# Patient Record
Sex: Male | Born: 1980 | Hispanic: Yes | Marital: Married | Smoking: Never smoker
Health system: Southern US, Community
[De-identification: ages and names within clinical notes are randomized; demographics above are authoritative.]

---

## 2011-05-05 ENCOUNTER — Emergency Department (HOSPITAL_COMMUNITY): Payer: Self-pay

## 2011-05-05 ENCOUNTER — Emergency Department (HOSPITAL_COMMUNITY)
Admission: EM | Admit: 2011-05-05 | Discharge: 2011-05-05 | Disposition: A | Discharge status: Home / Self Care | Payer: Self-pay | Attending: Emergency Medicine | Admitting: Emergency Medicine

## 2011-05-05 ENCOUNTER — Encounter: Payer: Self-pay | Admitting: *Deleted

## 2011-05-05 DIAGNOSIS — R10813 Right lower quadrant abdominal tenderness: Secondary | ICD-10-CM | POA: Insufficient documentation

## 2011-05-05 DIAGNOSIS — R109 Unspecified abdominal pain: Secondary | ICD-10-CM | POA: Insufficient documentation

## 2011-05-05 LAB — BASIC METABOLIC PANEL
BUN: 13 mg/dL (ref 6–23)
Chloride: 100 mEq/L (ref 96–112)
Glucose, Bld: 102 mg/dL — ABNORMAL HIGH (ref 70–99)
Potassium: 4.4 mEq/L (ref 3.5–5.1)

## 2011-05-05 LAB — URINALYSIS, ROUTINE W REFLEX MICROSCOPIC
Bilirubin Urine: NEGATIVE
Hgb urine dipstick: NEGATIVE
Specific Gravity, Urine: 1.029 (ref 1.005–1.030)
pH: 6 (ref 5.0–8.0)

## 2011-05-05 LAB — DIFFERENTIAL
Eosinophils Absolute: 0 10*3/uL (ref 0.0–0.7)
Lymphs Abs: 1.8 10*3/uL (ref 0.7–4.0)
Monocytes Absolute: 0.6 10*3/uL (ref 0.1–1.0)
Monocytes Relative: 5 % (ref 3–12)
Neutro Abs: 11.3 10*3/uL — ABNORMAL HIGH (ref 1.7–7.7)
Neutrophils Relative %: 82 % — ABNORMAL HIGH (ref 43–77)

## 2011-05-05 LAB — CBC
HCT: 42.6 % (ref 39.0–52.0)
Hemoglobin: 14.3 g/dL (ref 13.0–17.0)
MCH: 27.7 pg (ref 26.0–34.0)
RBC: 5.17 MIL/uL (ref 4.22–5.81)

## 2011-05-05 MED ORDER — SODIUM CHLORIDE 0.9 % IV SOLN: 20.0000 mL | INTRAVENOUS | Status: DC

## 2011-05-05 MED ORDER — IOHEXOL 300 MG/ML  SOLN
100.0000 mL | Freq: Once | INTRAMUSCULAR | Status: AC | PRN
  Administered 2011-05-05: 100 mL via INTRAVENOUS

## 2011-05-05 NOTE — ED Notes (Signed)
Ct done.

## 2011-05-05 NOTE — ED Notes (Signed)
RN getting labs when starting IV 

## 2011-05-05 NOTE — ED Notes (Signed)
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

## 2011-05-05 NOTE — ED Notes (Signed)
Surgery in to see patient.

## 2011-05-05 NOTE — ED Provider Notes (Signed)
History     CSN: [id number] Arrival date & time: 05/05/2011  5:36 PM   First MD Initiated Contact with Patient 05/05/11 1854      Chief Complaint  Patient presents with  . Abdominal Pain    (Consider location/radiation/quality/duration/timing/severity/associated sxs/prior treatment) Patient is a 30 y.o. male presenting with abdominal pain. The history is provided by the patient.  Abdominal Pain The primary symptoms of the illness include abdominal pain. The primary symptoms of the illness do not include fever, nausea, vomiting or diarrhea. Episode onset: today. The onset of the illness was sudden. The problem has been gradually improving.  The patient has not had a change in bowel habit. Symptoms associated with the illness do not include chills, constipation, urgency, hematuria, frequency or back pain. Significant associated medical issues do not include diabetes.   Pt notes pain is located in the RLQ, has improved but persists.  History reviewed. No pertinent past medical history.  History reviewed. No pertinent past surgical history.  No family history on file.  History  Substance Use Topics  . Smoking status: Never Smoker   . Smokeless tobacco: Not on file  . Alcohol Use: No      Review of Systems  Constitutional: Negative.  Negative for fever, chills and appetite change.  Gastrointestinal: Positive for abdominal pain. Negative for nausea, vomiting, diarrhea and constipation.  Genitourinary: Negative for urgency, frequency and hematuria.  Musculoskeletal: Negative for back pain.    Allergies  Review of patient's allergies indicates no known allergies.  Home Medications   Current Outpatient Rx  Name Route Sig Dispense Refill  . RANITIDINE HCL 75 MG PO TABS Oral Take 75 mg by mouth 2 (two) times daily.        BP 132/58  Pulse 64  Temp(Src) 98.6 F (37 C) (Oral)  Resp 16  Wt 200 lb (90.719 kg)  SpO2 100%  Physical Exam  Constitutional: He is oriented to  person, place, and time. He appears well-developed and well-nourished.  HENT:  Head: Normocephalic and atraumatic.  Cardiovascular: Normal rate and regular rhythm.   Pulmonary/Chest: Effort normal and breath sounds normal.  Abdominal: Soft. Bowel sounds are normal. He exhibits no mass. There is tenderness. There is no rebound and no guarding.       Mild to moderate RLQ tenderness.    Musculoskeletal: Normal range of motion.  Neurological: He is alert and oriented to person, place, and time.  Skin: Skin is warm and dry.    ED Course  Procedures (including critical care time)   Labs Reviewed  CBC  DIFFERENTIAL  BASIC METABOLIC PANEL  URINALYSIS, ROUTINE W REFLEX MICROSCOPIC   No results found.   No diagnosis found.    MDM  Pt reexamined, pain has improved, minimal RLQ on reexamination, improved from initial exam.    2200- Discussed with Dr Ezzard Standing who advises discussing with the pt and offering admission vs follow up tomorrow.  Pt requests follow up tomorrow, understands need to return immediately for worsening symptoms.  Discussed with Dr Juleen China who agrees with the plan.        Achilles Dunk [place], [place] 05/05/11 [address] [place], [place] 05/06/11 [phone]

## 2011-05-05 NOTE — ED Notes (Signed)
Patient that his head was hurting made RN aware of it

## 2011-05-06 ENCOUNTER — Encounter (HOSPITAL_COMMUNITY): Payer: Self-pay | Admitting: Emergency Medicine

## 2011-05-06 ENCOUNTER — Emergency Department (HOSPITAL_COMMUNITY)
Admission: EM | Admit: 2011-05-06 | Discharge: 2011-05-06 | Disposition: A | Discharge status: Home / Self Care | Payer: Self-pay | Attending: Emergency Medicine | Admitting: Emergency Medicine

## 2011-05-06 DIAGNOSIS — R1031 Right lower quadrant pain: Secondary | ICD-10-CM | POA: Insufficient documentation

## 2011-05-06 DIAGNOSIS — R630 Anorexia: Secondary | ICD-10-CM | POA: Insufficient documentation

## 2011-05-06 LAB — CBC
HCT: 42.7 % (ref 39.0–52.0)
Hemoglobin: 14.8 g/dL (ref 13.0–17.0)
MCH: 28 pg (ref 26.0–34.0)
MCV: 80.7 fL (ref 78.0–100.0)
RBC: 5.29 MIL/uL (ref 4.22–5.81)

## 2011-05-06 LAB — BASIC METABOLIC PANEL
BUN: 12 mg/dL (ref 6–23)
GFR calc non Af Amer: >90 mL/min (ref >90)
Glucose, Bld: 88 mg/dL (ref 70–99)
Potassium: 3.6 mEq/L (ref 3.5–5.1)

## 2011-05-06 LAB — DIFFERENTIAL
Eosinophils Absolute: 0.1 10*3/uL (ref 0.0–0.7)
Eosinophils Relative: 1 % (ref 0–5)
Lymphs Abs: 1.9 10*3/uL (ref 0.7–4.0)
Monocytes Relative: 6 % (ref 3–12)

## 2011-05-06 NOTE — ED Provider Notes (Signed)
Medical screening examination/treatment/procedure(s) were performed by non-physician practitioner and as supervising physician I was immediately available for consultation/collaboration.  Kerline Trahan, MD 05/06/11 0128 

## 2011-05-06 NOTE — ED Provider Notes (Signed)
History     CSN: [id number] Arrival date & time: 05/06/2011  2:07 PM   First MD Initiated Contact with Patient 05/06/11 1504      Chief Complaint  Patient presents with  . Abdominal Pain    (Consider location/radiation/quality/duration/timing/severity/associated sxs/prior treatment) HPI Comments: Patient presents to the emergency department today for reevaluation for right lower quadrant pain that began yesterday afternoon at approximately 1:30 PM.  Patient was seen here in this emergency department and did have labs completed that showed a white count of 13 and a CT abdomen pelvis that showed possible appendicitis but it was not definitive.  Per the emergency department note the physician did contact the surgeon on call Dr. Ezzard Standing who recommended either admission for observation versus discharge home with followup today.  The patient has come back today for his followup.  He reports that he still has discomfort in his right lower quadrant but it is improved from yesterday.  He's had no nausea or vomiting but does note some decreased appetite.  No changes in bowel habits and had a normal bowel movement this morning.  He denies any fevers.  No prior abdominal surgeries no other medical problems.  He does note that he still has some pain in the right lower quadrant that is worse with moving around.  Patient is a 30 y.o. male presenting with abdominal pain. The history is provided by the patient. No language interpreter was used.  Abdominal Pain The primary symptoms of the illness include abdominal pain. The primary symptoms of the illness do not include fever, fatigue, shortness of breath, nausea, vomiting, diarrhea, hematemesis, hematochezia or dysuria. The current episode started yesterday. The onset of the illness was gradual. The problem has been gradually improving.  The patient has not had a change in bowel habit. Additional symptoms associated with the illness include anorexia. Symptoms  associated with the illness do not include chills, diaphoresis, heartburn, constipation, urgency, hematuria, frequency or back pain.    History reviewed. No pertinent past medical history.  History reviewed. No pertinent past surgical history.  No family history on file.  History  Substance Use Topics  . Smoking status: Never Smoker   . Smokeless tobacco: Not on file  . Alcohol Use: No      Review of Systems  Constitutional: Negative.  Negative for fever, chills, diaphoresis and fatigue.  HENT: Negative.   Eyes: Negative.  Negative for discharge and redness.  Respiratory: Negative.  Negative for cough and shortness of breath.   Cardiovascular: Negative.  Negative for chest pain.  Gastrointestinal: Positive for abdominal pain and anorexia. Negative for heartburn, nausea, vomiting, diarrhea, constipation, hematochezia and hematemesis.  Genitourinary: Negative.  Negative for dysuria, urgency, frequency and hematuria.  Musculoskeletal: Negative.  Negative for back pain.  Skin: Negative.  Negative for color change and rash.  Neurological: Negative for syncope and headaches.  Hematological: Negative.  Negative for adenopathy.  Psychiatric/Behavioral: Negative.  Negative for confusion.  All other systems reviewed and are negative.    Allergies  Review of patient's allergies indicates no known allergies.  Home Medications   Current Outpatient Rx  Name Route Sig Dispense Refill  . RANITIDINE HCL 75 MG PO TABS Oral Take 75 mg by mouth 2 (two) times daily.       There were no vitals taken for this visit.  Physical Exam  Constitutional: He is oriented to person, place, and time. He appears well-developed and well-nourished.  Non-toxic appearance. He does not have a sickly  appearance.  HENT:  Head: Normocephalic and atraumatic.  Eyes: Conjunctivae, EOM and lids are normal. Pupils are equal, round, and reactive to light.  Neck: Trachea normal, normal range of motion and full  passive range of motion without pain. Neck supple.  Cardiovascular: Normal rate, regular rhythm and normal heart sounds.   Pulmonary/Chest: Effort normal and breath sounds normal. No respiratory distress. He has no wheezes. He has no rales.  Abdominal: Soft. Normal appearance. He exhibits no distension. There is tenderness in the right lower quadrant. There is no rebound and no CVA tenderness.       Mild tenderness to palpation in the right lower quadrant.  Musculoskeletal: Normal range of motion.  Neurological: He is alert and oriented to person, place, and time. He has normal strength.  Skin: Skin is warm, dry and intact. No rash noted.  Psychiatric: He has a normal mood and affect. His behavior is normal. Judgment and thought content normal.    ED Course  Procedures (including critical care time)  Results for orders placed during the hospital encounter of 05/06/11  CBC      Component Value Range   WBC 7.8  4.0 - 10.5 (K/uL)   RBC 5.29  4.22 - 5.81 (MIL/uL)   Hemoglobin 14.8  13.0 - 17.0 (g/dL)   HCT 69.6  29.5 - 28.4 (%)   MCV 80.7  78.0 - 100.0 (fL)   MCH 28.0  26.0 - 34.0 (pg)   MCHC 34.7  30.0 - 36.0 (g/dL)   RDW 13.2  44.0 - 10.2 (%)   Platelets 227  150 - 400 (K/uL)  DIFFERENTIAL      Component Value Range   Neutrophils Relative 68  43 - 77 (%)   Neutro Abs 5.3  1.7 - 7.7 (K/uL)   Lymphocytes Relative 24  12 - 46 (%)   Lymphs Abs 1.9  0.7 - 4.0 (K/uL)   Monocytes Relative 6  3 - 12 (%)   Monocytes Absolute 0.5  0.1 - 1.0 (K/uL)   Eosinophils Relative 1  0 - 5 (%)   Eosinophils Absolute 0.1  0.0 - 0.7 (K/uL)   Basophils Relative 0  0 - 1 (%)   Basophils Absolute 0.0  0.0 - 0.1 (K/uL)  BASIC METABOLIC PANEL      Component Value Range   Sodium 140  135 - 145 (mEq/L)   Potassium 3.6  3.5 - 5.1 (mEq/L)   Chloride 103  96 - 112 (mEq/L)   CO2 25  19 - 32 (mEq/L)   Glucose, Bld 88  70 - 99 (mg/dL)   BUN 12  6 - 23 (mg/dL)   Creatinine, Ser 7.25  0.50 - 1.35 (mg/dL)    Calcium 9.6  8.4 - 10.5 (mg/dL)   GFR calc non Af Amer >90  >90 (mL/min)   GFR calc Af Amer >90  >90 (mL/min)   Ct Abdomen Pelvis W Contrast  05/05/2011  *RADIOLOGY REPORT*  Clinical Data: Persistent right lower quadrant pain with some improvement.  CT ABDOMEN AND PELVIS WITH CONTRAST  Technique:  Multidetector CT imaging of the abdomen and pelvis was performed following the standard protocol during bolus administration of intravenous contrast.  Contrast: OMNIPAQUE IOHEXOL 300 MG/ML IV SOLN  Comparison: None.  Findings: The lung bases are clear.  There is no pleural effusion. The liver, gallbladder, biliary system and pancreas appear normal. The spleen, adrenal glands and right kidney appear normal.  The left kidney demonstrates multiple parapelvic cysts.  Delayed images demonstrate no hydronephrosis or delay in contrast excretion. However, there does appear to be mild perinephric soft tissue stranding on the left.  The bowel gas pattern is nonobstructive.  Retrocecal appendix is prominent, measuring 9 mm in diameter on image 64.  There is some air within its lumen.  No obvious surrounding inflammatory change is seen.  The cecum and terminal hilum appear normal.  The urinary bladder, prostate gland and seminal vesicles appear normal.  IMPRESSION:  1.  Mildly distended appendix is without definite surrounding inflammatory change.  This could reflect atypical appendicitis in the appropriate clinical context.  The patient's complete blood count is currently pending. 2.  No evidence of bowel obstruction or perforation. 3.  Left renal parapelvic cysts with mild perinephric soft tissue stranding.  No evidence of ureterectasis or ureteral obstruction.  Original Report Authenticated By: Gerrianne Scale, M.D.       MDM  The patient reevaluated today with improvement in his abdominal pain.  His white blood cell count is also decreased to a normal level.  He is afebrile and with normal vital signs.  I have  rediscussed this patient with Dr. Donell Beers from general surgery and she agrees with plan to discharge the patient home with instructions to return for worsening abdominal pain, fevers, anorexia, vomiting or other signs concerning for possible appendicitis.  Patient is comfortable with this plan at this time.        Nat Christen, MD 05/06/11 [phone]

## 2011-05-06 NOTE — ED Notes (Signed)
Pt is wanting to follow up about possible appendicitis that he was seen for yesterday.  They gave him the option to stay or come back today.  Pt chose to go home and come back.  States his pain is much better.  Denies any n/v/d.  When asked about pain, he states that he feels no pain, it's just uncomfortable.

## 2011-05-06 NOTE — ED Notes (Signed)
Pt was seen ysterday for abd pain and states was instructed to return today for a follow up for appendicitis, denies any pain at this time. No n/v states that he does feel better.

## 2012-08-17 IMAGING — CT CT ABD-PELV W/ CM
1 of 3 series · 14 of 32 positions shown, 19 images · IV contrast (APPLIED)
Comparison: None.

CLINICAL DATA: Persistent right lower quadrant pain with some
improvement.

CT ABDOMEN AND PELVIS WITH CONTRAST
TECHNIQUE: Multidetector CT imaging of the abdomen and pelvis was
performed following the standard protocol during bolus
administration of intravenous contrast.
Contrast: 100mL OMNIPAQUE IOHEXOL 300 MG/ML IV SOLN

[Series 2: abd/pel with · axial · 0.74mm/px · z∈[+874,+1300]mm · 14 of 95 slices shown, 19 images]
[im 5/95  soft-tissue]
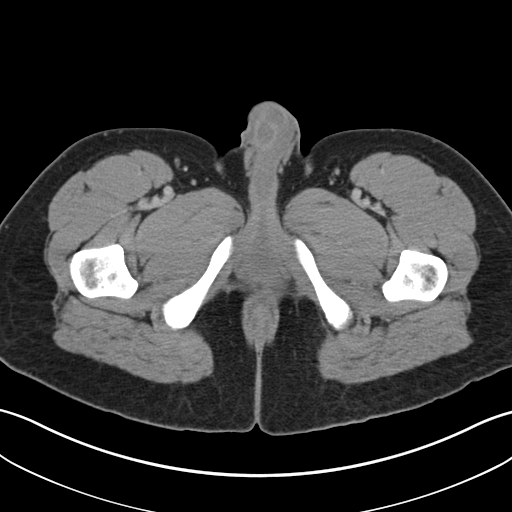
[im 5/95  bone]
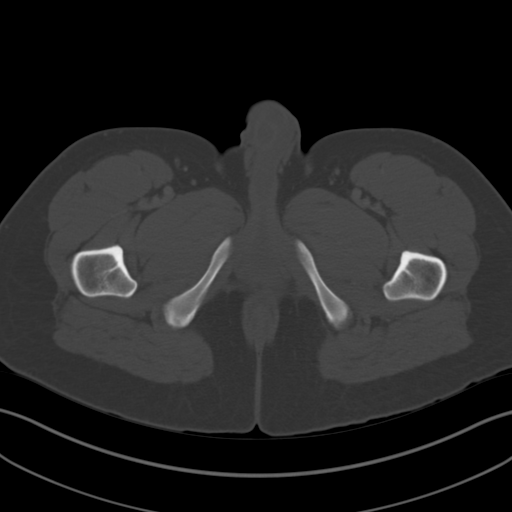
[im 15/95  soft-tissue]
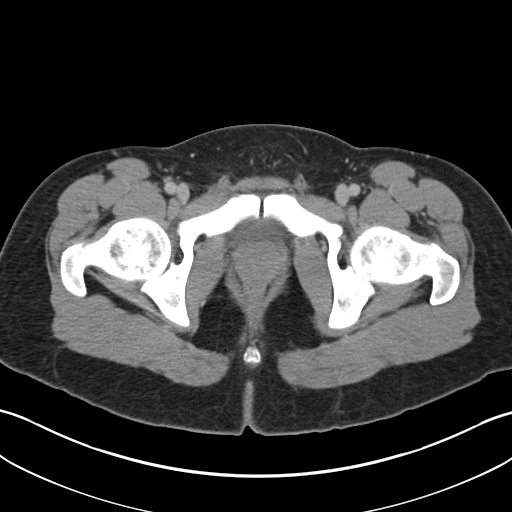
[im 19/95  soft-tissue]
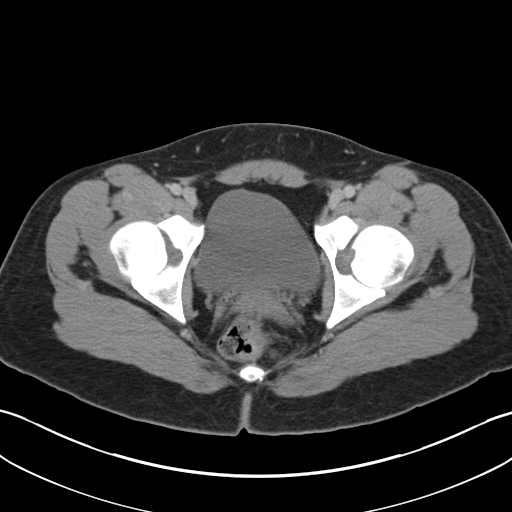
[im 29/95  soft-tissue]
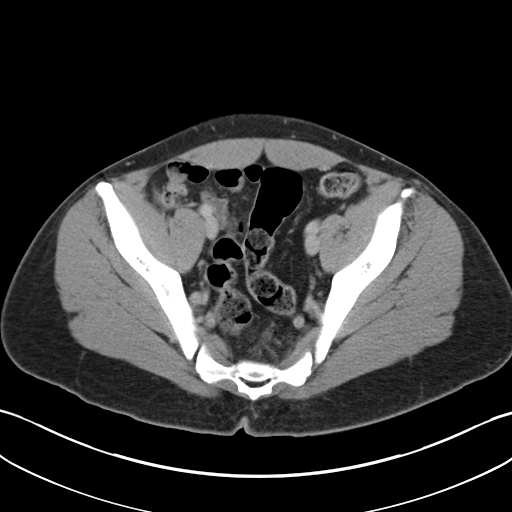
[im 33/95  soft-tissue]
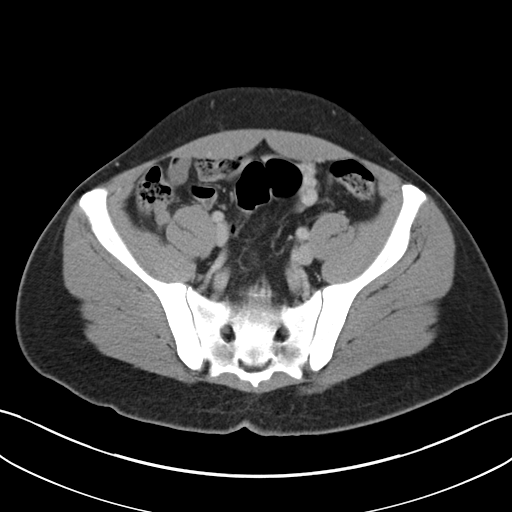
[im 43/95  soft-tissue]
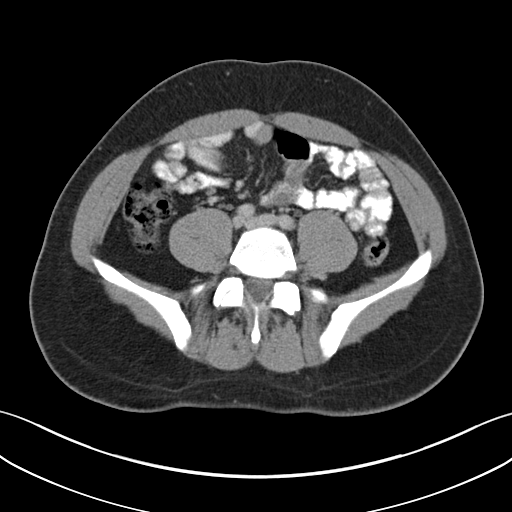
[im 48/95  soft-tissue]
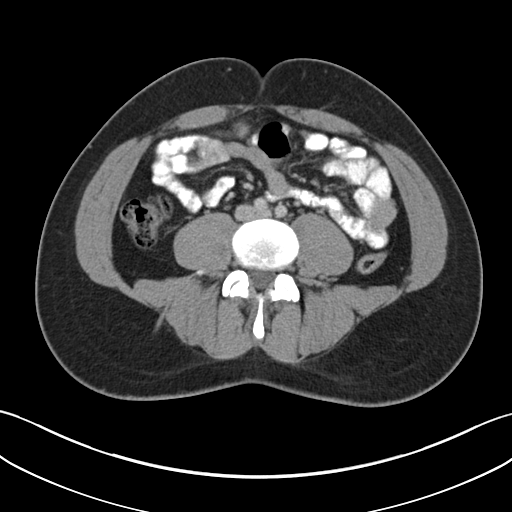
[im 52/95  soft-tissue]
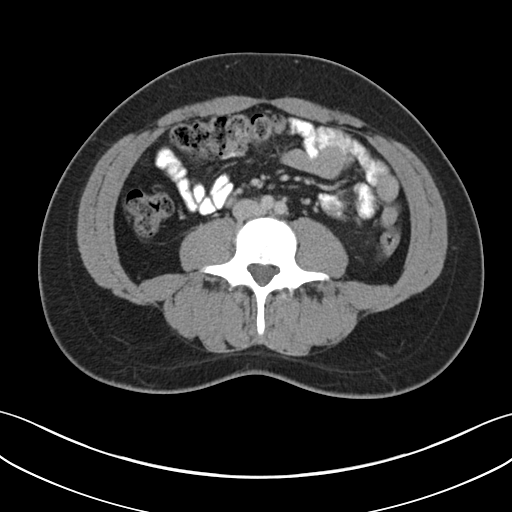
[im 62/95  soft-tissue]
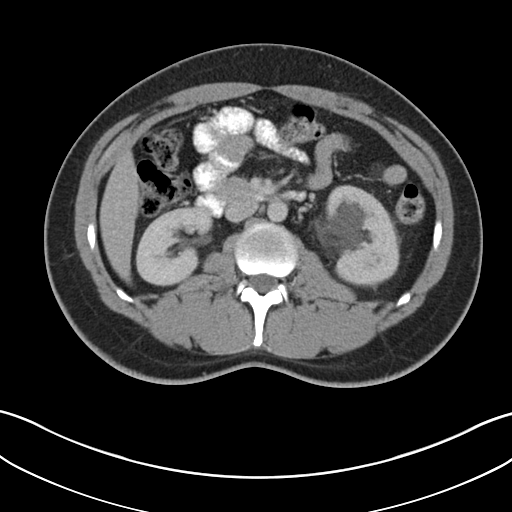
[im 62/95  bone]
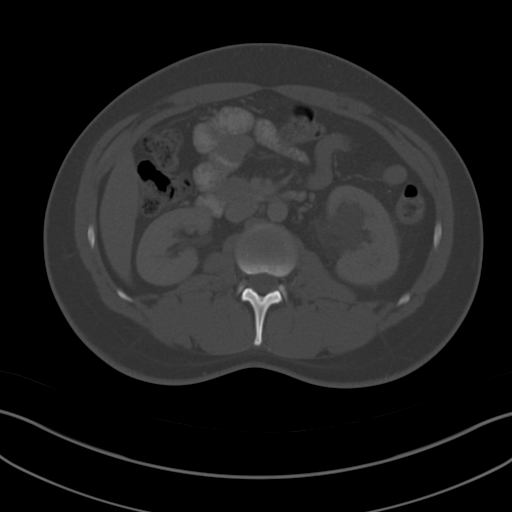
[im 66/95  soft-tissue]
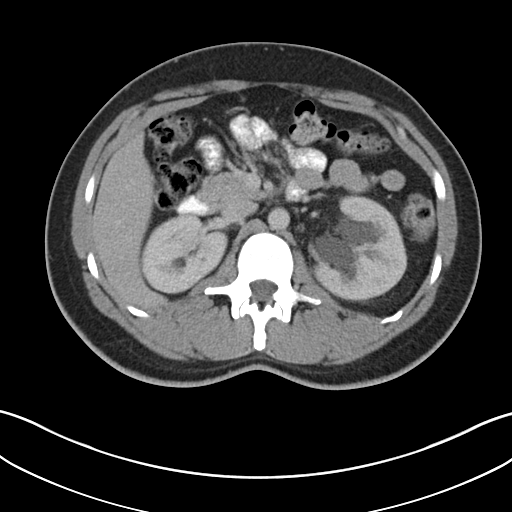
[im 76/95  soft-tissue]
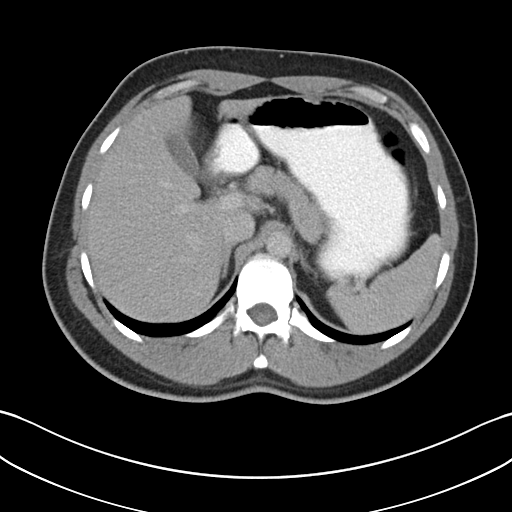
[im 76/95  lung]
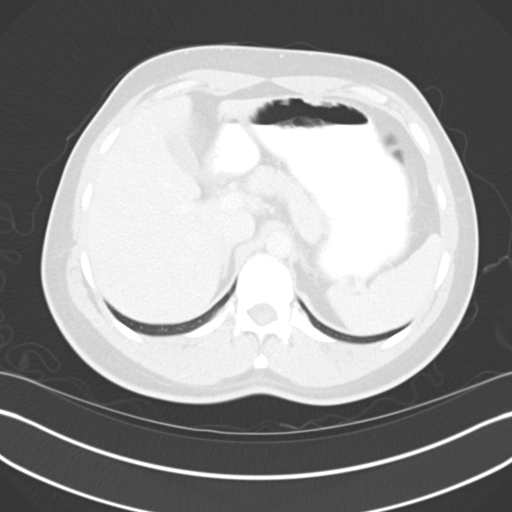
[im 80/95  soft-tissue]
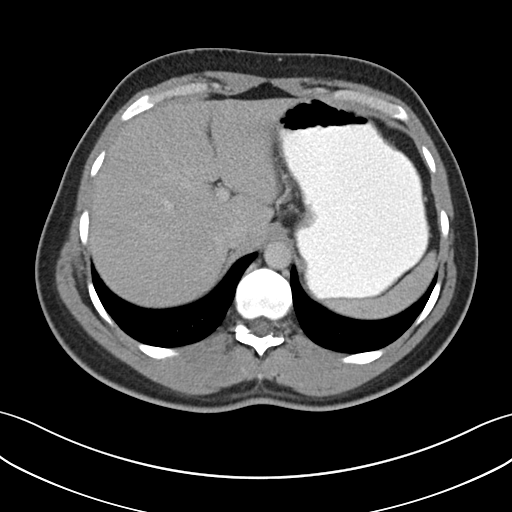
[im 80/95  lung]
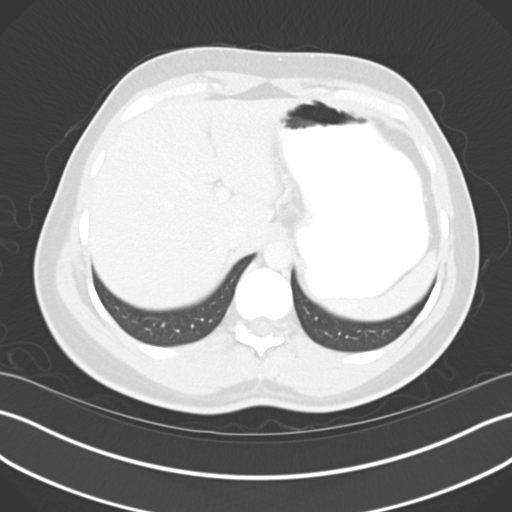
[im 85/95  lung]
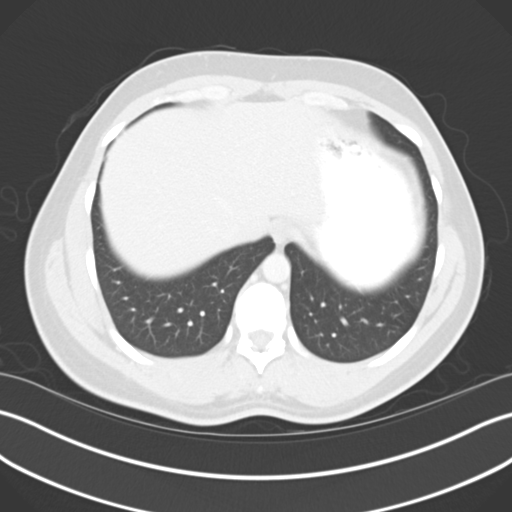
[im 90/95  soft-tissue]
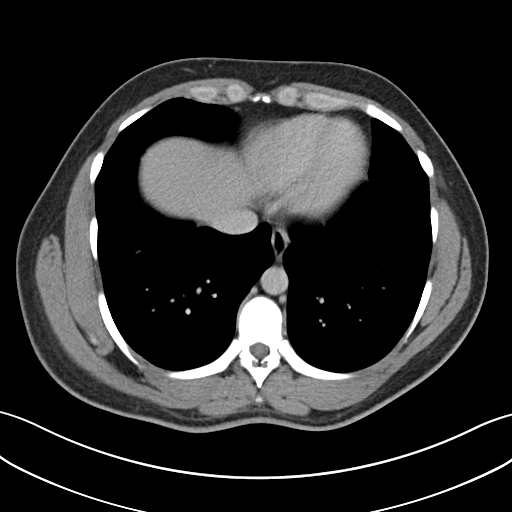
[im 90/95  lung]
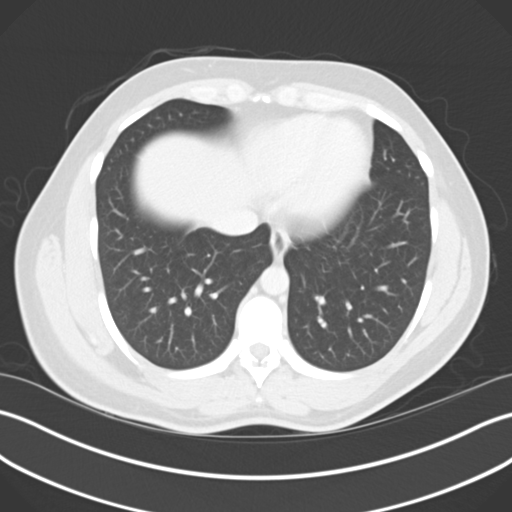

[14 of 32 positions shown; findings below may reference images not displayed]

FINDINGS: The lung bases are clear.  There is no pleural effusion.
The liver, gallbladder, biliary system and pancreas appear normal.
The spleen, adrenal glands and right kidney appear normal.  The
left kidney demonstrates multiple parapelvic cysts.  Delayed images
demonstrate no hydronephrosis or delay in contrast excretion.
However, there does appear to be mild perinephric soft tissue
stranding on the left.

The bowel gas pattern is nonobstructive.  Retrocecal appendix is
prominent, measuring 9 mm in diameter on image 64.  There is some
air within its lumen.  No obvious surrounding inflammatory change
is seen.  The cecum and terminal hilum appear normal.  The urinary
bladder, prostate gland and seminal vesicles appear normal.
IMPRESSION: 1.  Mildly distended appendix is without definite surrounding
inflammatory change.  This could reflect atypical appendicitis in
the appropriate clinical context.  The patient's complete blood
count is currently pending.
2.  No evidence of bowel obstruction or perforation.
3.  Left renal parapelvic cysts with mild perinephric soft tissue
stranding.  No evidence of ureterectasis or ureteral obstruction.

## 2014-04-24 ENCOUNTER — Encounter (HOSPITAL_COMMUNITY): Payer: Self-pay | Admitting: Emergency Medicine

## 2014-04-24 ENCOUNTER — Emergency Department (HOSPITAL_COMMUNITY)
Admission: EM | Admit: 2014-04-24 | Discharge: 2014-04-24 | Disposition: A | Discharge status: Home / Self Care | Payer: Managed Care, Other (non HMO) | Attending: Emergency Medicine | Admitting: Emergency Medicine

## 2014-04-24 DIAGNOSIS — K088 Other specified disorders of teeth and supporting structures: Secondary | ICD-10-CM | POA: Diagnosis present

## 2014-04-24 DIAGNOSIS — K047 Periapical abscess without sinus: Secondary | ICD-10-CM | POA: Insufficient documentation

## 2014-04-24 DIAGNOSIS — Z79899 Other long term (current) drug therapy: Secondary | ICD-10-CM | POA: Insufficient documentation

## 2014-04-24 MED ORDER — HYDROCODONE-ACETAMINOPHEN 5-325 MG PO TABS: 1.0000 | ORAL_TABLET | Freq: Four times a day (QID) | ORAL | Status: AC | PRN

## 2014-04-24 MED ORDER — PENICILLIN V POTASSIUM 500 MG PO TABS: 500.0000 mg | ORAL_TABLET | Freq: Four times a day (QID) | ORAL | Status: AC

## 2014-04-24 NOTE — ED Notes (Signed)
Pt arrived to the ED with a complaint of dental pain.  Pt has a broken tooth on the left bottom jaw that has been there for "years.' Pt states in the last few days it has gotten irritated and pt woke up today with swelling of the left lower jaw.

## 2014-04-24 NOTE — Discharge Instructions (Signed)
Continue to take ibuprofen for pain. Take Norco for severe pain. Take penicillin as prescribed until all gone. Follow-up with a dentist next week. Return if worsening symptoms   Dental Abscess A dental abscess is a collection of infected fluid (pus) from a bacterial infection in the inner part of the tooth (pulp). It usually occurs at the end of the tooth's root.  CAUSES   Severe tooth decay.  Trauma to the tooth that allows bacteria to enter into the pulp, such as a broken or chipped tooth. SYMPTOMS   Severe pain in and around the infected tooth.  Swelling and redness around the abscessed tooth or in the mouth or face.  Tenderness.  Pus drainage.  Bad breath.  Bitter taste in the mouth.  Difficulty swallowing.  Difficulty opening the mouth.  Nausea.  Vomiting.  Chills.  Swollen neck glands. DIAGNOSIS   A medical and dental history will be taken.  An examination will be performed by tapping on the abscessed tooth.  X-rays may be taken of the tooth to identify the abscess. TREATMENT The goal of treatment is to eliminate the infection. You may be prescribed antibiotic medicine to stop the infection from spreading. A root canal may be performed to save the tooth. If the tooth cannot be saved, it may be pulled (extracted) and the abscess may be drained.  HOME CARE INSTRUCTIONS  Only take over-the-counter or prescription medicines for pain, fever, or discomfort as directed by your caregiver.  Rinse your mouth (gargle) often with salt water ( tsp salt in 8 oz [250 ml] of warm water) to relieve pain or swelling.  Do not drive after taking pain medicine (narcotics).  Do not apply heat to the outside of your face.  Return to your dentist for further treatment as directed. SEEK MEDICAL CARE IF:  Your pain is not helped by medicine.  Your pain is getting worse instead of better. SEEK IMMEDIATE MEDICAL CARE IF:  You have a fever or persistent symptoms for more than  2-3 days.  You have a fever and your symptoms suddenly get worse.  You have chills or a very bad headache.  You have problems breathing or swallowing.  You have trouble opening your mouth.  You have swelling in the neck or around the eye. Document Released: 05/18/2005 Document Revised: 02/10/2012 Document Reviewed: 08/26/2010 [facility] Patient Information 2015 [place], [place]. This information is not intended to replace advice given to you by your health care provider. Make sure you discuss any questions you have with your health care provider.

## 2014-04-24 NOTE — ED Provider Notes (Signed)
CSN: [id number]     Arrival date & time 04/24/14  2050 History  This chart was scribed for non-physician practitioner, Jaynie Crumbleatyana Acsa Estey, PA-C working with Ward GivensIva L Knapp, MD by Angelene GiovanniEmmanuella Mensah, ED Scribe. The patient was seen in room WTR8/WTR8 and the patient's care was started at 9:15 PM    Chief Complaint  Patient presents with  . Dental Pain   The history is provided by the patient. No language interpreter was used.   HPI Comments: Cole Weber is a 33 y.o. male who presents to the Emergency Department complaining of an infected tooth onset 2 days ago with associated left lower jaw swelling and pain onset today. He denies ear pain. He reports taking 2 tablets of 200 mg Ibuprofen with relief PTA. He reports that he has had a broken tooth for a while. He reports a peanut allergy. 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   History reviewed. No pertinent past medical history. History reviewed. No pertinent past surgical history. History reviewed. No pertinent family history. History  Substance Use Topics  . Smoking status: Never Smoker   . Smokeless tobacco: Not on file  . Alcohol Use: No    Review of Systems  Constitutional: Negative for fever and chills.  HENT: Positive for dental problem. Negative for ear pain.       Allergies  Review of patient's allergies indicates no known allergies.  Home Medications   Prior to Admission medications   Medication Sig Start Date End Date Taking? Authorizing Provider  ranitidine (HEARTBURN RELIEF) 75 MG tablet Take 75 mg by mouth 2 (two) times daily.     Historical Provider, MD   BP 139/89 mmHg  Pulse 88  Temp(Src) 97.8 F (36.6 C) (Oral)  Resp 18  Ht 6' (1.829 m)  Wt 200 lb (90.719 kg)  BMI 27.12 kg/m2  SpO2 99% Physical Exam  Constitutional: He is oriented to person, place, and time. He appears well-developed and well-nourished. No distress.  HENT:  Head: Normocephalic and atraumatic.  Dental caries in both left upper and lower  molars. There is left maxillary swelling, tender to palpation. Mild left upper gum swelling surrounding molars 1 and 2. No trismus. No swelling under the tongue. No submandibular lymphadenopathy  Eyes: Conjunctivae and EOM are normal.  Neck: Neck supple. No tracheal deviation present.  Cardiovascular: Normal rate.   Pulmonary/Chest: Effort normal. No respiratory distress.  Musculoskeletal: Normal range of motion.  Neurological: He is alert and oriented to person, place, and time.  Skin: Skin is warm and dry.  Psychiatric: He has a normal mood and affect. His behavior is normal.  Nursing note and vitals reviewed.   ED Course  Procedures (including critical care time) DIAGNOSTIC STUDIES: Oxygen Saturation is 99% on RA, normal by my interpretation.    COORDINATION OF CARE: 9:17 PM- Pt advised of plan for treatment and pt agrees.    Labs Review Labs Reviewed - No data to display  Imaging Review No results found.   EKG Interpretation None      MDM   Final diagnoses:  Dental abscess     patient with dental abscess. Afebrile. Nontoxic appearing. No evidence of Ludwig's angina. Will start on penicillin, Norco for pain, continue ibuprofen. Follow-up with his dentist.   Ceasar MonsFiled Vitals:   04/24/14 2055  BP: 139/89  Pulse: 88  Temp: 97.8 F (36.6 C)  Resp: 18    I personally performed the services described in this documentation, which was scribed in my presence.  The recorded information has been reviewed and is accurate.    Lottie Musselatyana A Cashtyn Pouliot, PA-C 04/24/14 2127  Ward GivensIva L Knapp, MD 04/24/14 2148
